# Patient Record
Sex: Female | Born: 1945 | Race: White | Hispanic: No | Smoking: Never smoker
Health system: Southern US, Community
[De-identification: ages and names within clinical notes are randomized; demographics above are authoritative.]

## PROBLEM LIST (undated history)

## (undated) DIAGNOSIS — E039 Hypothyroidism, unspecified: Secondary | ICD-10-CM

## (undated) HISTORY — PX: TONSILLECTOMY: SUR1361

---

## 2007-05-12 ENCOUNTER — Ambulatory Visit: Payer: Self-pay | Admitting: Internal Medicine

## 2009-10-26 ENCOUNTER — Ambulatory Visit: Payer: Self-pay | Admitting: Internal Medicine

## 2011-12-11 ENCOUNTER — Ambulatory Visit: Payer: Self-pay | Admitting: Internal Medicine

## 2013-06-11 ENCOUNTER — Telehealth: Payer: Self-pay | Admitting: Gastroenterology

## 2013-06-11 DIAGNOSIS — R932 Abnormal findings on diagnostic imaging of liver and biliary tract: Secondary | ICD-10-CM

## 2013-06-11 NOTE — Telephone Encounter (Signed)
i reviewed info on desk  She needs upper eus, 60 min, radial +/- linear, next avail EUS thurday with MAC sedation, dx: abnormal pancreas

## 2013-06-14 ENCOUNTER — Encounter (HOSPITAL_COMMUNITY): Payer: Self-pay | Admitting: Pharmacy Technician

## 2013-06-14 ENCOUNTER — Other Ambulatory Visit: Payer: Self-pay

## 2013-06-14 DIAGNOSIS — R932 Abnormal findings on diagnostic imaging of liver and biliary tract: Secondary | ICD-10-CM

## 2013-06-14 NOTE — Telephone Encounter (Signed)
Pt has been scheduled for 06/24/13 1215 pm [facility]

## 2013-06-15 ENCOUNTER — Other Ambulatory Visit: Payer: Self-pay | Admitting: Urgent Care

## 2013-06-15 ENCOUNTER — Encounter (HOSPITAL_COMMUNITY): Payer: Self-pay | Admitting: *Deleted

## 2013-06-15 NOTE — Telephone Encounter (Signed)
Left message on machine to call back  

## 2013-06-16 LAB — CANCER ANTIGEN 19-9: CA 19-9: 5 U/mL (ref 0–35)

## 2013-06-16 NOTE — Telephone Encounter (Signed)
EUS scheduled, pt instructed and medications reviewed.  Patient instructions mailed to home.  Patient to call with any questions or concerns.  

## 2013-06-24 ENCOUNTER — Encounter (HOSPITAL_COMMUNITY): Payer: Self-pay | Admitting: Anesthesiology

## 2013-06-24 ENCOUNTER — Encounter (HOSPITAL_COMMUNITY)
Admission: RE | Disposition: A | Discharge status: Home / Self Care | Payer: Self-pay | Source: Ambulatory Visit | Attending: Gastroenterology

## 2013-06-24 ENCOUNTER — Encounter (HOSPITAL_COMMUNITY): Payer: Self-pay | Admitting: Gastroenterology

## 2013-06-24 ENCOUNTER — Ambulatory Visit (HOSPITAL_COMMUNITY): Payer: BC Managed Care – PPO | Admitting: Anesthesiology

## 2013-06-24 ENCOUNTER — Ambulatory Visit (HOSPITAL_COMMUNITY)
Admission: RE | Admit: 2013-06-24 | Discharge: 2013-06-24 | Disposition: A | Discharge status: Home / Self Care | Payer: BC Managed Care – PPO | Source: Ambulatory Visit | Attending: Gastroenterology | Admitting: Gastroenterology

## 2013-06-24 DIAGNOSIS — R630 Anorexia: Secondary | ICD-10-CM | POA: Insufficient documentation

## 2013-06-24 DIAGNOSIS — R932 Abnormal findings on diagnostic imaging of liver and biliary tract: Secondary | ICD-10-CM

## 2013-06-24 DIAGNOSIS — R933 Abnormal findings on diagnostic imaging of other parts of digestive tract: Secondary | ICD-10-CM

## 2013-06-24 DIAGNOSIS — R634 Abnormal weight loss: Secondary | ICD-10-CM | POA: Insufficient documentation

## 2013-06-24 DIAGNOSIS — K862 Cyst of pancreas: Secondary | ICD-10-CM | POA: Insufficient documentation

## 2013-06-24 DIAGNOSIS — E039 Hypothyroidism, unspecified: Secondary | ICD-10-CM | POA: Insufficient documentation

## 2013-06-24 HISTORY — DX: Hypothyroidism, unspecified: E03.9

## 2013-06-24 HISTORY — PX: EUS: SHX5427

## 2013-06-24 SURGERY — UPPER ENDOSCOPIC ULTRASOUND (EUS) LINEAR
Anesthesia: Monitor Anesthesia Care

## 2013-06-24 MED ORDER — CIPROFLOXACIN HCL 500 MG PO TABS: 500.0000 mg | ORAL_TABLET | Freq: Two times a day (BID) | ORAL | Status: AC

## 2013-06-24 MED ORDER — CIPROFLOXACIN IN D5W 400 MG/200ML IV SOLN
400.0000 mg | Freq: Once | INTRAVENOUS | Status: AC
  Administered 2013-06-24: 400 mg via INTRAVENOUS

## 2013-06-24 MED ORDER — SODIUM CHLORIDE 0.9 % IV SOLN: INTRAVENOUS | Status: DC

## 2013-06-24 MED ORDER — PROPOFOL INFUSION 10 MG/ML OPTIME
INTRAVENOUS | Status: DC | PRN
  Administered 2013-06-24: 70 ug/kg/min via INTRAVENOUS

## 2013-06-24 MED ORDER — BUTAMBEN-TETRACAINE-BENZOCAINE 2-2-14 % EX AERO
INHALATION_SPRAY | CUTANEOUS | Status: DC | PRN
  Administered 2013-06-24: 2 via TOPICAL

## 2013-06-24 MED ORDER — MIDAZOLAM HCL 5 MG/5ML IJ SOLN
INTRAMUSCULAR | Status: DC | PRN
  Administered 2013-06-24: 2 mg via INTRAVENOUS

## 2013-06-24 MED ORDER — CIPROFLOXACIN IN D5W 400 MG/200ML IV SOLN
INTRAVENOUS | Status: AC
  Filled 2013-06-24: qty 200

## 2013-06-24 MED ORDER — KETAMINE HCL 10 MG/ML IJ SOLN
INTRAMUSCULAR | Status: DC | PRN
  Administered 2013-06-24: 10 mg via INTRAVENOUS

## 2013-06-24 MED ORDER — LACTATED RINGERS IV SOLN
INTRAVENOUS | Status: DC | PRN
  Administered 2013-06-24: 11:00:00 via INTRAVENOUS

## 2013-06-24 MED ORDER — LACTATED RINGERS IV SOLN
INTRAVENOUS | Status: DC
  Administered 2013-06-24: 1000 mL via INTRAVENOUS

## 2013-06-24 NOTE — Anesthesia Preprocedure Evaluation (Addendum)
Anesthesia Evaluation  Patient identified by MRN, date of birth, ID band Patient awake    Reviewed: Allergy & Precautions, H&P , NPO status , Patient's Chart, lab work & pertinent test results  Airway Mallampati: II TM Distance: >3 FB Neck ROM: Full    Dental  (+) Dental Advisory Given   Pulmonary neg pulmonary ROS,  breath sounds clear to auscultation        Cardiovascular negative cardio ROS  Rhythm:Regular Rate:Normal     Neuro/Psych negative neurological ROS  negative psych ROS   GI/Hepatic negative GI ROS, Neg liver ROS,   Endo/Other  Hypothyroidism   Renal/GU negative Renal ROS     Musculoskeletal negative musculoskeletal ROS (+)   Abdominal   Peds  Hematology negative hematology ROS (+)   Anesthesia Other Findings   Reproductive/Obstetrics negative OB ROS                          Anesthesia Physical Anesthesia Plan  ASA: II  Anesthesia Plan: MAC   Post-op Pain Management:    Induction:   Airway Management Planned:   Additional Equipment:   Intra-op Plan:   Post-operative Plan:   Informed Consent: I have reviewed the patients History and Physical, chart, labs and discussed the procedure including the risks, benefits and alternatives for the proposed anesthesia with the patient or authorized representative who has indicated his/her understanding and acceptance.   Dental advisory given  Plan Discussed with: CRNA  Anesthesia Plan Comments:         Anesthesia Quick Evaluation

## 2013-06-24 NOTE — Transfer of Care (Signed)
Immediate Anesthesia Transfer of Care Note  Patient: Alexa Meza  Procedure(s) Performed: Procedure(s): UPPER ENDOSCOPIC ULTRASOUND (EUS) LINEAR (N/A)  Patient Location: PACU  Anesthesia Type:MAC  Level of Consciousness: sedated  Airway & Oxygen Therapy: spontaneous respirations on nasal cannula  Post-op Assessment: Report given to PACU RN and Post -op Vital signs reviewed and stable  Post vital signs: Reviewed and stable  Complications: No apparent anesthesia complications

## 2013-06-24 NOTE — Op Note (Signed)
[facility] [address] [place], [address]   ENDOSCOPIC ULTRASOUND PROCEDURE REPORT  PATIENT: Alexa Meza, Alexa Meza  MR#: [id number] BIRTHDATE: January 19, 1946  GENDER: Female ENDOSCOPIST: Rachael Fee, MD REFERRED BY:  Midge Minium, MD at [facility] in [place], [place] PROCEDURE DATE:  06/24/2013 PROCEDURE:   Upper EUS w/FNA ASA CLASS:      Class II INDICATIONS:   anorexia, mild weight loss led to CT scan; this read as 'prominant pancreatic head and 7mm mass in body'. MEDICATIONS: MAC sedation, administered by CRNA; cirpo 400mg  IV  DESCRIPTION OF PROCEDURE:   After the risks benefits and alternatives of the procedure were  explained, informed consent was obtained. The patient was then placed in the left, lateral, decubitus postion and IV sedation was administered. Throughout the procedure, the patients blood pressure, pulse and oxygen saturations were monitored continuously.  Under direct visualization, the Pentax EUS Radial [id number]  endoscope was introduced through the mouth  and advanced to the second portion of the duodenum .  Water was used as necessary to provide an acoustic interface.  Upon completion of the imaging, water was removed and the patient was sent to the recovery room in satisfactory condition.   Endoscopic findings: 1. Normal UGI tract  EUS findings: 1. Anechoic (cystic) 6mm lesion in body of pancreas that clearly does NOT communicate with the main pancreatic duct. There are no associated solid masses or nodules. The cyst fluid was completely aspirated with a single pass of a 22 guage EUS FNA needle.  2cc of clear, thin fluid was all sent to cytology. 2. The pancreatic parenchyma was otherwise normal throughout the gland. 3. No peripancreatic adenopathy. 4. CBD was normal, non-dilated. 5. Main pancreatic duct was normal, non-dilated 6. Gallbladder was normal. 7. Limited views of liver, spleen, portal and splenic vessels  were all normal.  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  This was aspirated, sent to cytology.  Will advise on follow up pending cytology results.  She will complete 3 days of twice daily cipro.   _______________________________ eSigned:  Rachael Fee, MD 06/24/2013 11:53 AM

## 2013-06-24 NOTE — Anesthesia Postprocedure Evaluation (Signed)
Anesthesia Post Note  Patient: Alexa Meza  Procedure(s) Performed: Procedure(s) (LRB): UPPER ENDOSCOPIC ULTRASOUND (EUS) LINEAR (N/A)  Anesthesia type: MAC  Patient location: PACU  Post pain: Pain level controlled  Post assessment: Post-op Vital signs reviewed  Last Vitals: BP 144/97  Pulse 71  Temp(Src) 36.8 C (Oral)  Resp 24  Ht 5\' 3"  (1.6 m)  Wt 124 lb (56.246 kg)  BMI 21.97 kg/m2  SpO2 98%  Post vital signs: Reviewed  Level of consciousness: awake  Complications: No apparent anesthesia complications

## 2013-06-24 NOTE — H&P (Signed)
  HPI: This is a woman who underwent CT scan (outside).  This was done for anorexia and weight loss.  Report read as 'prominant pancreatic head and also 7mm 'mass' in body'.     Past Medical History  Diagnosis Date  . Hypothyroidism     HX OF NO CURRENT MEDS FOR, SEES  FAMILY MD FOR    Past Surgical History  Procedure Laterality Date  . Tonsillectomy  AS CHILD    No current facility-administered medications for this encounter.    Allergies as of 06/14/2013 - Review Complete 06/14/2013  Allergen Reaction Noted  . Contrast media [iodinated diagnostic agents] Other (See Comments) 06/14/2013    History reviewed. No pertinent family history.  History   Social History  . Marital Status: Divorced    Spouse Name: N/A    Number of Children: N/A  . Years of Education: N/A   Occupational History  . Not on file.   Social History Main Topics  . Smoking status: Never Smoker   . Smokeless tobacco: Never Used  . Alcohol Use: No  . Drug Use: No  . Sexual Activity: Not on file   Other Topics Concern  . Not on file   Social History Narrative  . No narrative on file      Physical Exam: There were no vitals taken for this visit. Constitutional: generally well-appearing Psychiatric: alert and oriented x3 Abdomen: soft, nontender, nondistended, no obvious ascites, no peritoneal signs, normal bowel sounds     Assessment and plan: 67 y.o. female with abnormal pancreas  For upper EUS today.

## 2013-06-25 ENCOUNTER — Encounter (HOSPITAL_COMMUNITY): Payer: Self-pay | Admitting: Gastroenterology

## 2013-07-12 ENCOUNTER — Ambulatory Visit: Payer: Self-pay | Admitting: Internal Medicine

## 2013-08-26 ENCOUNTER — Other Ambulatory Visit: Payer: Self-pay

## 2017-06-28 ENCOUNTER — Emergency Department: Payer: Medicare Other

## 2017-06-28 ENCOUNTER — Emergency Department
Admission: EM | Admit: 2017-06-28 | Discharge: 2017-06-28 | Disposition: A | Discharge status: Home / Self Care | Payer: Medicare Other | Attending: Emergency Medicine | Admitting: Emergency Medicine

## 2017-06-28 DIAGNOSIS — K862 Cyst of pancreas: Secondary | ICD-10-CM | POA: Diagnosis not present

## 2017-06-28 DIAGNOSIS — R140 Abdominal distension (gaseous): Secondary | ICD-10-CM | POA: Diagnosis not present

## 2017-06-28 DIAGNOSIS — Z79899 Other long term (current) drug therapy: Secondary | ICD-10-CM | POA: Diagnosis not present

## 2017-06-28 DIAGNOSIS — M549 Dorsalgia, unspecified: Secondary | ICD-10-CM | POA: Diagnosis present

## 2017-06-28 DIAGNOSIS — B029 Zoster without complications: Secondary | ICD-10-CM | POA: Insufficient documentation

## 2017-06-28 DIAGNOSIS — R197 Diarrhea, unspecified: Secondary | ICD-10-CM | POA: Diagnosis not present

## 2017-06-28 DIAGNOSIS — E039 Hypothyroidism, unspecified: Secondary | ICD-10-CM | POA: Diagnosis not present

## 2017-06-28 LAB — CBC WITH DIFFERENTIAL/PLATELET
Basophils Absolute: 0 10*3/uL (ref 0–0.1)
Basophils Relative: 1 %
Eosinophils Absolute: 0.1 10*3/uL (ref 0–0.7)
Eosinophils Relative: 2 %
HCT: 40.2 % (ref 35.0–47.0)
Hemoglobin: 14 g/dL (ref 12.0–16.0)
Lymphocytes Relative: 19 %
Lymphs Abs: 1.4 10*3/uL (ref 1.0–3.6)
MCH: 32.8 pg (ref 26.0–34.0)
MCHC: 34.9 g/dL (ref 32.0–36.0)
MCV: 93.9 fL (ref 80.0–100.0)
Monocytes Absolute: 0.6 10*3/uL (ref 0.2–0.9)
Monocytes Relative: 8 %
Neutro Abs: 5.4 10*3/uL (ref 1.4–6.5)
Neutrophils Relative %: 70 %
Platelets: 275 10*3/uL (ref 150–440)
RBC: 4.28 MIL/uL (ref 3.80–5.20)
RDW: 12.5 % (ref 11.5–14.5)
WBC: 7.5 10*3/uL (ref 3.6–11.0)

## 2017-06-28 LAB — COMPREHENSIVE METABOLIC PANEL
ALT: 17 U/L (ref 14–54)
AST: 24 U/L (ref 15–41)
Albumin: 4.3 g/dL (ref 3.5–5.0)
Alkaline Phosphatase: 63 U/L (ref 38–126)
Anion gap: 10 (ref 5–15)
BUN: 10 mg/dL (ref 6–20)
CO2: 22 mmol/L (ref 22–32)
Calcium: 9.6 mg/dL (ref 8.9–10.3)
Chloride: 106 mmol/L (ref 101–111)
Creatinine, Ser: 0.48 mg/dL (ref 0.44–1.00)
GFR calc Af Amer: >60 mL/min (ref >60)
GFR calc non Af Amer: >60 mL/min (ref >60)
Glucose, Bld: 81 mg/dL (ref 65–99)
Potassium: 3.7 mmol/L (ref 3.5–5.1)
Sodium: 138 mmol/L (ref 135–145)
Total Bilirubin: 0.9 mg/dL (ref 0.3–1.2)
Total Protein: 7.5 g/dL (ref 6.5–8.1)

## 2017-06-28 LAB — URINALYSIS, COMPLETE (UACMP) WITH MICROSCOPIC
Bacteria, UA: NONE SEEN
Bilirubin Urine: NEGATIVE
Glucose, UA: NEGATIVE mg/dL
Ketones, ur: 20 mg/dL — AB
Leukocytes, UA: NEGATIVE
Nitrite: NEGATIVE
Protein, ur: NEGATIVE mg/dL
Specific Gravity, Urine: 1.011 (ref 1.005–1.030)
Squamous Epithelial / HPF: NONE SEEN
pH: 5 (ref 5.0–8.0)

## 2017-06-28 LAB — TROPONIN I: Troponin I: <0.03 ng/mL (ref <0.03)

## 2017-06-28 LAB — LIPASE, BLOOD: Lipase: 27 U/L (ref 11–51)

## 2017-06-28 LAB — AMYLASE: Amylase: 57 U/L (ref 28–100)

## 2017-06-28 MED ORDER — ORPHENADRINE CITRATE 30 MG/ML IJ SOLN
60.0000 mg | Freq: Two times a day (BID) | INTRAMUSCULAR | Status: DC
  Administered 2017-06-28: 60 mg via INTRAMUSCULAR
  Filled 2017-06-28: qty 2

## 2017-06-28 MED ORDER — ONDANSETRON HCL 4 MG PO TABS: 4.0000 mg | ORAL_TABLET | Freq: Three times a day (TID) | ORAL | 1 refills | Status: AC | PRN

## 2017-06-28 MED ORDER — ACYCLOVIR 800 MG PO TABS: 800.0000 mg | ORAL_TABLET | Freq: Every day | ORAL | 0 refills | Status: AC

## 2017-06-28 MED ORDER — IOPAMIDOL (ISOVUE-300) INJECTION 61%
30.0000 mL | Freq: Once | INTRAVENOUS | Status: DC | PRN
  Filled 2017-06-28: qty 30

## 2017-06-28 MED ORDER — IOPAMIDOL (ISOVUE-370) INJECTION 76%
75.0000 mL | Freq: Once | INTRAVENOUS | Status: AC | PRN
  Administered 2017-06-28: 75 mL via INTRAVENOUS
  Filled 2017-06-28: qty 75

## 2017-06-28 MED ORDER — OXYCODONE-ACETAMINOPHEN 5-325 MG PO TABS: 1.0000 | ORAL_TABLET | Freq: Four times a day (QID) | ORAL | 0 refills | Status: AC | PRN

## 2017-06-28 NOTE — ED Triage Notes (Signed)
Pt came to ED via pov c/o back pain starting last night. Reports upper back pain. No urinary symptoms. History of herniated disc but pt reports that was many year ago.

## 2017-06-28 NOTE — ED Provider Notes (Signed)
-----------------------------------------   4:37 PM on 06/28/2017 -----------------------------------------  ED ECG REPORT I, Angline Schweigert, the attending physician, personally viewed and interpreted this ECG.  Date: 06/28/2017 EKG Time: 16:32 Rate: 72 Rhythm: normal sinus rhythm with rare PVC QRS Axis: normal Intervals: normal ST/T Wave abnormalities: normal Narrative Interpretation: no evidence of acute ischemia    Loleta RoseForbach, Hakim Minniefield, MD 06/28/17 [phone]

## 2017-06-28 NOTE — ED Provider Notes (Signed)
[facility] Emergency Department Provider Note  ____________________________________________  Time seen: Approximately 3:54 PM  I have reviewed the triage vital signs and the nursing notes.   HISTORY  Chief Complaint Back Pain    HPI Alexa Meza is a 71 y.o. female with a history of pancreatic cyst presents to the emergency department with 8/10 left "upper back pain" worse with sitting and improved with ambulation that started one day ago. Patient describes pain as an "incredible pressure" that radiates to left side in a dermatomal distribution. Patient states that she cannot sleep through the night due to pain. She states that she considered calling EMS but waited until this morning so that she could drive herself. Patient has never been transported to an ED via EMS in the past. She states that she felt "very hot" last night. She has also noticed abdominal distention. She denies dysuria, hematuria and increased urinary frequency. Patient denies a history of pyelonephritis or nephrolithiasis. Patient states that it is been "several years" since she had a urinary tract infection. Patient currently lives alone and works part-time as a Marketing executiveMontessori school teacher online. She is a retired Chartered loss adjusterschoolteacher. She has had intermittent diarrhea but denies nausea and vomiting. She denies chest pain, chest tightness, shortness of breath and daily smoking.   Past Medical History:  Diagnosis Date  . Hypothyroidism    HX OF NO CURRENT MEDS FOR, SEES  FAMILY MD FOR    Patient Active Problem List   Diagnosis Date Noted  . Nonspecific (abnormal) findings on radiological and other examination of gastrointestinal tract 06/24/2013    Past Surgical History:  Procedure Laterality Date  . EUS N/A 06/24/2013   Procedure: UPPER ENDOSCOPIC ULTRASOUND (EUS) LINEAR;  Surgeon: Rachael Feeaniel P Jacobs, MD;  Location: [facility] ENDOSCOPY;  Service: Endoscopy;  Laterality: N/A;  . TONSILLECTOMY  AS CHILD     Prior to Admission medications   Medication Sig Start Date End Date Taking? Authorizing Provider  acyclovir (ZOVIRAX) 800 MG tablet Take 1 tablet (800 mg total) by mouth 5 (five) times daily. 06/28/17 07/08/17  Orvil FeilWoods, Jaclyn M, PA-C  Aloe Vera GEL Apply topically as needed. TO CHEST AREA    [provider]  calcium carbonate (OS-CAL) 600 MG TABS tablet Take 600 mg by mouth 2 (two) times daily with a meal.    [provider]  cholecalciferol (VITAMIN D) 1000 UNITS tablet Take 1,000 Units by mouth daily.    [provider]  ciprofloxacin (CIPRO) 500 MG tablet Take 1 tablet (500 mg total) by mouth 2 (two) times daily. 06/24/13   Rachael FeeJacobs, Daniel P, MD  fish oil-omega-3 fatty acids 1000 MG capsule Take 1 g by mouth daily.    [provider]  Multiple Vitamin (MULTIVITAMIN WITH MINERALS) TABS tablet Take 1 tablet by mouth daily.    [provider]  ondansetron (ZOFRAN) 4 MG tablet Take 1 tablet (4 mg total) by mouth every 8 (eight) hours as needed for nausea or vomiting. 06/28/17 07/03/17  Orvil FeilWoods, Jaclyn M, PA-C  oxyCODONE-acetaminophen (ROXICET) 5-325 MG tablet Take 1 tablet by mouth every 6 (six) hours as needed for severe pain. 06/28/17 07/03/17  Orvil FeilWoods, Jaclyn M, PA-C    Allergies Patient has no active allergies.  No family history on file.  Social History Social History  Substance Use Topics  . Smoking status: Never Smoker  . Smokeless tobacco: Never Used  . Alcohol use No     Review of Systems  Constitutional: No fever/chills Eyes:  No visual changes. No discharge ENT: No upper respiratory complaints. Cardiovascular: no chest pain. Respiratory: no cough. No SOB. Gastrointestinal: No abdominal pain.  No nausea, no vomiting. She has diarrhea. No constipation. Musculoskeletal: Patient has left upper back pain.  Skin: Negative for rash, abrasions, lacerations, ecchymosis. Neurological: Negative for headaches, focal weakness or  numbness.  ___________________________________________   PHYSICAL EXAM:  VITAL SIGNS: ED Triage Vitals  Enc Vitals Group     BP 06/28/17 1424 (!) 168/93     Pulse Rate 06/28/17 1424 78     Resp 06/28/17 1424 18     Temp 06/28/17 1424 97.6 F (36.4 C)     Temp Source 06/28/17 1424 Oral     SpO2 06/28/17 1424 97 %     Weight --      Height --      Head Circumference --      Peak Flow --      Pain Score 06/28/17 1447 5     Pain Loc --      Pain Edu? --      Excl. in GC? --      Constitutional: Alert and oriented. Well appearing and in no acute distress. Eyes: Conjunctivae are normal. PERRL. EOMI. Head: Atraumatic. Cardiovascular: Normal rate, regular rhythm. Normal S1 and S2.  Good peripheral circulation. Respiratory: Normal respiratory effort without tachypnea or retractions. Lungs CTAB. Good air entry to the bases with no decreased or absent breath sounds. Gastrointestinal: Bowel sounds 4 quadrants. Soft and nontender to palpation. No guarding or rigidity. No palpable masses. No distention. Patient has left sided CVA tenderness.  Musculoskeletal: Full range of motion to all extremities. No gross deformities appreciated. Neurologic:  Normal speech and language. No gross focal neurologic deficits are appreciated.  Skin:  Faint macular rash is visible along the left flank. Psychiatric: Mood and affect are normal. Speech and behavior are normal. Patient exhibits appropriate insight and judgement.   ____________________________________________   LABS (all labs ordered are listed, but only abnormal results are displayed)  Labs Reviewed  URINALYSIS, COMPLETE (UACMP) WITH MICROSCOPIC - Abnormal; Notable for the following:       Result Value   Color, Urine YELLOW (*)    APPearance CLEAR (*)    Hgb urine dipstick SMALL (*)    Ketones, ur 20 (*)    All other components within normal limits  CBC WITH DIFFERENTIAL/PLATELET  COMPREHENSIVE METABOLIC PANEL  AMYLASE  LIPASE,  BLOOD  TROPONIN I   ____________________________________________  EKG  Normal sinus rhythm with occasional PVC ____________________________________________  RADIOLOGY  Geraldo Pitter, personally viewed and evaluated these images  as part of my medical decision making, as well as reviewing the written report by the radiologist.    Dg Chest 2 View  Result Date: 06/28/2017 CLINICAL DATA:  Pt states she began having upper back pain and discomfort last night; pt denies any previous hx; nonsmoker EXAM: CHEST  2 VIEW COMPARISON:  None. FINDINGS: Heart size and mediastinal contours are normal. Lungs are clear. No pleural effusion or pneumothorax seen. Mild degenerative spurring noted within the thoracic spine. No acute or suspicious osseous finding. IMPRESSION: No active cardiopulmonary disease. No evidence of pneumonia or pulmonary edema. Electronically Signed   By: Bary Richard M.D.   On: 06/28/2017 17:21   Dg Thoracic Spine 2 View  Result Date: 06/28/2017 CLINICAL DATA:  Mid back pain radiating to LEFT side for 2-3 days much worse at night, intermittent LEFT shoulder pain for 2-3 months  EXAM: THORACIC SPINE 2 VIEWS COMPARISON:  Chest radiograph 06/28/2017 FINDINGS: Twelve pairs of ribs. Bones appear demineralized. Scattered mild disc space narrowing. Vertebral body heights maintained without fracture or subluxation. No bone destruction. Minimal biconvex thoracic scoliosis. Visualized posterior ribs unremarkable. IMPRESSION: Osseous mineralization with minimal scoliosis degenerative disc disease changes. No definite acute bony abnormalities. Electronically Signed   By: Ulyses Southward M.D.   On: 06/28/2017 18:08   Ct Angio Abd/pel W/ And/or W/o  Result Date: 06/28/2017 CLINICAL DATA:  Back pain starting last night, pulsatile abdominal mass +abdominal aortic aneurysm EXAM: CTA ABDOMEN AND PELVIS WITHOUT AND WITH CONTRAST TECHNIQUE: Multidetector CT imaging of the abdomen and pelvis was performed  using the standard protocol during bolus administration of intravenous contrast. Multiplanar reconstructed images and MIPs were obtained and reviewed to evaluate the vascular anatomy. CONTRAST:  75 cc Isovue 370 IV ; no oral contrast administered COMPARISON:  None FINDINGS: VASCULAR Aorta: Aorta normal caliber without aneurysm or dissection. Minimal atherosclerotic calcification. No para-aortic hemorrhage/infiltration. Celiac: Mild narrowing at the origin due to calcified plaque, approximately 50%. Normal caliber. SMA: Patent, unremarkable. Renals: Patent single renal arteries bilaterally. IMA: Patent Inflow: Patent , normal appearance Proximal Outflow: Normal appearance Veins: Un opacified, suboptimally assessed Review of the MIP images confirms the above findings. NON-VASCULAR Lower chest: Lung bases clear Hepatobiliary: Minimal dependent density in gallbladder question tiny gallstones. Liver unremarkable. No biliary dilatation Pancreas: Cystic lesion at proximal pancreatic tail 11 x 9 x 11 mm in size with a small calcification in the wall. Remainder of pancreas normal appearance Spleen: Normal appearance Adrenals/Urinary Tract: Adrenal glands, kidneys, ureters, and bladder normal appearance Stomach/Bowel: Normal appendix. Stomach and bowel loops unremarkable for technique. Lymphatic: No adenopathy. Reproductive: Unremarkable uterus and ovaries Other: No free air or free fluid. No hernia or inflammatory process. Musculoskeletal: Advanced degenerative disc disease changes at L1-L2 and L2-L3. No acute bony findings. IMPRESSION: VASCULAR No evidence of aortic aneurysm or dissection. 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 NON-VASCULAR Cystic lesion at proximal pancreatic tail 11 x 9 x 11 mm containing a small mural calcification. Recommend follow up pre and post contrast MRI/MRCP or pancreatic protocol CT in 2 years. This recommendation follows ACR consensus guidelines:  Management of Incidental Pancreatic Cysts: A White Paper of the ACR Incidental Findings Committee. J Am Coll Radiol 2017;14:911-923. Electronically Signed   By: Ulyses Southward M.D.   On: 06/28/2017 18:29    ____________________________________________    PROCEDURES  Procedure(s) performed:    Procedures    Medications  orphenadrine (NORFLEX) injection 60 mg (60 mg Intramuscular Given 06/28/17 1618)  iopamidol (ISOVUE-300) 61 % injection 30 mL ( Oral Canceled Entry 06/28/17 1722)  iopamidol (ISOVUE-370) 76 % injection 75 mL (75 mLs Intravenous Contrast Given 06/28/17 1739)     ____________________________________________   INITIAL IMPRESSION / ASSESSMENT AND PLAN / ED COURSE  Pertinent labs & imaging results that were available during my care of the patient were reviewed by me and considered in my medical decision making (see chart for details).  Review of the [place] CSRS was performed in accordance of the [facility] prior to dispensing any controlled drugs.     Assessment and Plan:  Back pain Patient presents to the emergency department with left flank pain described as "incredible pressure". Differential diagnosis included nephrolithiasis, pyelonephritis, mesenteric ischemia, abdominal aortic aneurysm and shingles. Workup conducted in the emergency department was reassuring. Dr.Goodman was consulted regarding patient's case. Patient was treated empirically for shingles. She  was discharged with acyclovir, Roxicet and Zofran. Patient was advised to follow-up with primary care as needed. All patient questions were answered.   ____________________________________________  FINAL CLINICAL IMPRESSION(S) / ED DIAGNOSES  Final diagnoses:  Herpes zoster without complication      NEW MEDICATIONS STARTED DURING THIS VISIT:  Discharge Medication List as of 06/28/2017  7:11 PM    START taking these medications   Details  acyclovir (ZOVIRAX) 800 MG tablet Take 1 tablet (800 mg total) by mouth 5  (five) times daily., Starting Sat 06/28/2017, Until Tue 07/08/2017, Print    ondansetron (ZOFRAN) 4 MG tablet Take 1 tablet (4 mg total) by mouth every 8 (eight) hours as needed for nausea or vomiting., Starting Sat 06/28/2017, Until Thu 07/03/2017, Print    oxyCODONE-acetaminophen (ROXICET) 5-325 MG tablet Take 1 tablet by mouth every 6 (six) hours as needed for severe pain., Starting Sat 06/28/2017, Until Thu 07/03/2017, Print            This chart was dictated using voice recognition software/Dragon. Despite best efforts to proofread, errors can occur which can change the meaning. Any change was purely unintentional.    Orvil Feil, PA-C 06/28/17 1956    Phineas Semen, MD 06/28/17 2120

## 2019-08-17 ENCOUNTER — Other Ambulatory Visit: Payer: Self-pay | Admitting: *Deleted

## 2019-08-17 DIAGNOSIS — Z20822 Contact with and (suspected) exposure to covid-19: Secondary | ICD-10-CM

## 2019-08-19 LAB — NOVEL CORONAVIRUS, NAA: SARS-CoV-2, NAA: NOT DETECTED

## 2019-09-13 ENCOUNTER — Other Ambulatory Visit: Payer: Self-pay

## 2019-09-13 DIAGNOSIS — Z20822 Contact with and (suspected) exposure to covid-19: Secondary | ICD-10-CM

## 2019-09-15 LAB — NOVEL CORONAVIRUS, NAA: SARS-CoV-2, NAA: NOT DETECTED

## 2021-07-31 ENCOUNTER — Other Ambulatory Visit: Payer: Self-pay | Admitting: Internal Medicine

## 2021-07-31 ENCOUNTER — Other Ambulatory Visit (HOSPITAL_BASED_OUTPATIENT_CLINIC_OR_DEPARTMENT_OTHER): Payer: Self-pay | Admitting: Internal Medicine

## 2021-07-31 DIAGNOSIS — K862 Cyst of pancreas: Secondary | ICD-10-CM

## 2021-07-31 DIAGNOSIS — R5382 Chronic fatigue, unspecified: Secondary | ICD-10-CM

## 2021-08-01 ENCOUNTER — Other Ambulatory Visit: Payer: Self-pay | Admitting: Internal Medicine

## 2021-08-01 DIAGNOSIS — Z1231 Encounter for screening mammogram for malignant neoplasm of breast: Secondary | ICD-10-CM

## 2021-08-11 ENCOUNTER — Ambulatory Visit
Admission: RE | Admit: 2021-08-11 | Discharge: 2021-08-11 | Disposition: A | Discharge status: Home / Self Care | Payer: Medicare PPO | Source: Ambulatory Visit | Attending: Internal Medicine | Admitting: Internal Medicine

## 2021-08-11 ENCOUNTER — Other Ambulatory Visit: Payer: Self-pay

## 2021-08-11 DIAGNOSIS — K862 Cyst of pancreas: Secondary | ICD-10-CM | POA: Diagnosis present

## 2021-08-11 DIAGNOSIS — R5382 Chronic fatigue, unspecified: Secondary | ICD-10-CM

## 2021-08-11 MED ORDER — GADOBUTROL 1 MMOL/ML IV SOLN
5.0000 mL | Freq: Once | INTRAVENOUS | Status: AC | PRN
  Administered 2021-08-11: 5 mL via INTRAVENOUS

## 2021-09-05 ENCOUNTER — Other Ambulatory Visit: Payer: Self-pay

## 2021-09-05 ENCOUNTER — Ambulatory Visit
Admission: RE | Admit: 2021-09-05 | Discharge: 2021-09-05 | Disposition: A | Discharge status: Home / Self Care | Payer: Medicare PPO | Source: Ambulatory Visit | Attending: Internal Medicine | Admitting: Internal Medicine

## 2021-09-05 DIAGNOSIS — Z1231 Encounter for screening mammogram for malignant neoplasm of breast: Secondary | ICD-10-CM | POA: Insufficient documentation

## 2022-09-19 ENCOUNTER — Encounter: Payer: Self-pay | Admitting: Ophthalmology

## 2022-09-23 NOTE — Anesthesia Preprocedure Evaluation (Signed)
Anesthesia Evaluation  Patient identified by MRN, date of birth, ID band Patient awake    Reviewed: Allergy & Precautions, NPO status , Patient's Chart, lab work & pertinent test results  History of Anesthesia Complications Negative for: history of anesthetic complications  Airway Mallampati: I   Neck ROM: Full    Dental  (+) Missing   Pulmonary neg pulmonary ROS   Pulmonary exam normal breath sounds clear to auscultation       Cardiovascular hypertension, Normal cardiovascular exam Rhythm:Regular Rate:Normal     Neuro/Psych negative neurological ROS     GI/Hepatic negative GI ROS,,,  Endo/Other  Hypothyroidism    Renal/GU negative Renal ROS     Musculoskeletal   Abdominal   Peds  Hematology negative hematology ROS (+)   Anesthesia Other Findings   Reproductive/Obstetrics                             Anesthesia Physical Anesthesia Plan  ASA: 2  Anesthesia Plan: MAC   Post-op Pain Management:    Induction: Intravenous  PONV Risk Score and Plan: 2 and Treatment may vary due to age or medical condition, Midazolam and TIVA  Airway Management Planned: Natural Airway and Nasal Cannula  Additional Equipment:   Intra-op Plan:   Post-operative Plan:   Informed Consent: I have reviewed the patients History and Physical, chart, labs and discussed the procedure including the risks, benefits and alternatives for the proposed anesthesia with the patient or authorized representative who has indicated his/her understanding and acceptance.     Dental advisory given  Plan Discussed with: CRNA  Anesthesia Plan Comments: (LMA/GETA backup discussed.  Patient consented for risks of anesthesia including but not limited to:  - adverse reactions to medications - damage to eyes, teeth, lips or other oral mucosa - nerve damage due to positioning  - sore throat or hoarseness - damage to heart,  brain, nerves, lungs, other parts of body or loss of life  Informed patient about role of CRNA in peri- and intra-operative care.  Patient voiced understanding.)       Anesthesia Quick Evaluation

## 2022-09-23 NOTE — Discharge Instructions (Signed)

## 2022-09-25 ENCOUNTER — Encounter
Admission: RE | Disposition: A | Discharge status: Home / Self Care | Payer: Self-pay | Source: Home / Self Care | Attending: Ophthalmology

## 2022-09-25 ENCOUNTER — Ambulatory Visit
Admission: RE | Admit: 2022-09-25 | Discharge: 2022-09-25 | Disposition: A | Discharge status: Home / Self Care | Payer: Medicare PPO | Attending: Ophthalmology | Admitting: Ophthalmology

## 2022-09-25 ENCOUNTER — Ambulatory Visit: Payer: Medicare PPO | Admitting: General Practice

## 2022-09-25 ENCOUNTER — Other Ambulatory Visit: Payer: Self-pay

## 2022-09-25 DIAGNOSIS — H2512 Age-related nuclear cataract, left eye: Secondary | ICD-10-CM | POA: Diagnosis present

## 2022-09-25 DIAGNOSIS — E039 Hypothyroidism, unspecified: Secondary | ICD-10-CM | POA: Diagnosis not present

## 2022-09-25 HISTORY — PX: CATARACT EXTRACTION W/PHACO: SHX586

## 2022-09-25 SURGERY — PHACOEMULSIFICATION, CATARACT, WITH IOL INSERTION
Anesthesia: Monitor Anesthesia Care | Site: Eye | Laterality: Left

## 2022-09-25 MED ORDER — SIGHTPATH DOSE#1 NA HYALUR & NA CHOND-NA HYALUR IO KIT
PACK | INTRAOCULAR | Status: DC | PRN
  Administered 2022-09-25: 1 via OPHTHALMIC

## 2022-09-25 MED ORDER — BRIMONIDINE TARTRATE-TIMOLOL 0.2-0.5 % OP SOLN
OPHTHALMIC | Status: DC | PRN
  Administered 2022-09-25: 1 [drp] via OPHTHALMIC

## 2022-09-25 MED ORDER — TETRACAINE HCL 0.5 % OP SOLN
1.0000 [drp] | OPHTHALMIC | Status: DC | PRN
  Administered 2022-09-25 (×2): 1 [drp] via OPHTHALMIC

## 2022-09-25 MED ORDER — ACETAMINOPHEN 325 MG PO TABS: 650.0000 mg | ORAL_TABLET | Freq: Once | ORAL | Status: DC | PRN

## 2022-09-25 MED ORDER — CEFUROXIME OPHTHALMIC INJECTION 1 MG/0.1 ML
INJECTION | OPHTHALMIC | Status: DC | PRN
  Administered 2022-09-25: .1 mL via INTRACAMERAL

## 2022-09-25 MED ORDER — MIDAZOLAM HCL 2 MG/2ML IJ SOLN
INTRAMUSCULAR | Status: DC | PRN
  Administered 2022-09-25: 2 mg via INTRAVENOUS

## 2022-09-25 MED ORDER — LACTATED RINGERS IV SOLN: INTRAVENOUS | Status: DC

## 2022-09-25 MED ORDER — SIGHTPATH DOSE#1 BSS IO SOLN
INTRAOCULAR | Status: DC | PRN
  Administered 2022-09-25: 64 mL via OPHTHALMIC

## 2022-09-25 MED ORDER — SIGHTPATH DOSE#1 BSS IO SOLN
INTRAOCULAR | Status: DC | PRN
  Administered 2022-09-25: 1 mL

## 2022-09-25 MED ORDER — FENTANYL CITRATE (PF) 100 MCG/2ML IJ SOLN
INTRAMUSCULAR | Status: DC | PRN
  Administered 2022-09-25 (×2): 50 ug via INTRAVENOUS

## 2022-09-25 MED ORDER — ONDANSETRON HCL 4 MG/2ML IJ SOLN: 4.0000 mg | Freq: Once | INTRAMUSCULAR | Status: DC | PRN

## 2022-09-25 MED ORDER — ACETAMINOPHEN 160 MG/5ML PO SOLN: 325.0000 mg | ORAL | Status: DC | PRN

## 2022-09-25 MED ORDER — ARMC OPHTHALMIC DILATING DROPS
1.0000 | OPHTHALMIC | Status: DC | PRN
  Administered 2022-09-25 (×3): 1 via OPHTHALMIC

## 2022-09-25 MED ORDER — SIGHTPATH DOSE#1 BSS IO SOLN
INTRAOCULAR | Status: DC | PRN
  Administered 2022-09-25: 15 mL

## 2022-09-25 SURGICAL SUPPLY — 20 items
CANNULA ANT/CHMB 27G (MISCELLANEOUS) IMPLANT
CANNULA ANT/CHMB 27GA (MISCELLANEOUS) IMPLANT
CATARACT SUITE SIGHTPATH (MISCELLANEOUS) ×1 IMPLANT
FEE CATARACT SUITE SIGHTPATH (MISCELLANEOUS) ×1 IMPLANT
GLOVE SRG 8 PF TXTR STRL LF DI (GLOVE) ×1 IMPLANT
GLOVE SURG ENC TEXT LTX SZ7.5 (GLOVE) ×1 IMPLANT
GLOVE SURG GAMMEX PI TX LF 7.5 (GLOVE) IMPLANT
GLOVE SURG UNDER POLY LF SZ8 (GLOVE) ×1
LENS IOL TECNIS EYHANCE 20.0 (Intraocular Lens) IMPLANT
NDL FILTER BLUNT 18X1 1/2 (NEEDLE) ×1 IMPLANT
NDL RETROBULBAR .5 NSTRL (NEEDLE) IMPLANT
NEEDLE FILTER BLUNT 18X1 1/2 (NEEDLE) ×1 IMPLANT
PACK VIT ANT 23G (MISCELLANEOUS) IMPLANT
RING MALYGIN 7.0 (MISCELLANEOUS) IMPLANT
SUT ETHILON 10-0 CS-B-6CS-B-6 (SUTURE)
SUT VICRYL  9 0 (SUTURE)
SUT VICRYL 9 0 (SUTURE) IMPLANT
SUTURE EHLN 10-0 CS-B-6CS-B-6 (SUTURE) IMPLANT
SYR 3ML LL SCALE MARK (SYRINGE) ×1 IMPLANT
WATER STERILE IRR 250ML POUR (IV SOLUTION) ×1 IMPLANT

## 2022-09-25 NOTE — Op Note (Signed)
OPERATIVE NOTE  OPIE FANTON [id number] 09/25/2022   PREOPERATIVE DIAGNOSIS:  Nuclear sclerotic cataract left eye. H25.12   POSTOPERATIVE DIAGNOSIS:    Nuclear sclerotic cataract left eye.     PROCEDURE:  Phacoemusification with posterior chamber intraocular lens placement of the left eye  Ultrasound time: Procedure(s) with comments: CATARACT EXTRACTION PHACO AND INTRAOCULAR LENS PLACEMENT (IOC) LEFT (Left) - 4.50 0:52.2  LENS:   Implant Name Type Inv. Item Serial No. Manufacturer Lot No. LRB No. Used Action  LENS IOL TECNIS EYHANCE 20.0 - [id number] Intraocular Lens LENS IOL TECNIS EYHANCE 20.0 [id number] [employer]  Left 1 Implanted      SURGEON:  Deirdre Evener, MD   ANESTHESIA:  Topical with tetracaine drops and 2% Xylocaine jelly, augmented with 1% preservative-free intracameral lidocaine.    COMPLICATIONS:  None.   DESCRIPTION OF PROCEDURE:  The patient was identified in the holding room and transported to the operating room and placed in the supine position under the operating microscope.  The left eye was identified as the operative eye and it was prepped and draped in the usual sterile ophthalmic fashion.   A 1 millimeter clear-corneal paracentesis was made at the 1:30 position.  0.5 ml of preservative-free 1% lidocaine was injected into the anterior chamber.  The anterior chamber was filled with Viscoat viscoelastic.  A 2.4 millimeter keratome was used to make a near-clear corneal incision at the 10:30 position.  .  A curvilinear capsulorrhexis was made with a cystotome and capsulorrhexis forceps.  Balanced salt solution was used to hydrodissect and hydrodelineate the nucleus.   Phacoemulsification was then used in stop and chop fashion to remove the lens nucleus and epinucleus.  The remaining cortex was then removed using the irrigation and aspiration handpiece. Provisc was then placed into the capsular bag to distend it for lens placement.  A lens was then  injected into the capsular bag.  The remaining viscoelastic was aspirated.   Wounds were hydrated with balanced salt solution.  The anterior chamber was inflated to a physiologic pressure with balanced salt solution.  No wound leaks were noted. Cefuroxime 0.1 ml of a 10mg /ml solution was injected into the anterior chamber for a dose of 1 mg of intracameral antibiotic at the completion of the case.   Timolol and Brimonidine drops were applied to the eye.  The patient was taken to the recovery room in stable condition without complications of anesthesia or surgery.  Racquelle Hyser 09/25/2022, 9:40 AM

## 2022-09-25 NOTE — Anesthesia Postprocedure Evaluation (Signed)
Anesthesia Post Note  Patient: Alexa Meza  Procedure(s) Performed: CATARACT EXTRACTION PHACO AND INTRAOCULAR LENS PLACEMENT (IOC) LEFT (Left: Eye)  Patient location during evaluation: PACU Anesthesia Type: MAC Level of consciousness: awake and alert, oriented and patient cooperative Pain management: pain level controlled Vital Signs Assessment: post-procedure vital signs reviewed and stable Respiratory status: spontaneous breathing, nonlabored ventilation and respiratory function stable Cardiovascular status: blood pressure returned to baseline and stable Postop Assessment: adequate PO intake Anesthetic complications: no   There were no known notable events for this encounter.   Last Vitals:  Vitals:   09/25/22 0942 09/25/22 0946  BP: (!) 146/93 (!) 146/93  Pulse: 67 67  Resp: 14 13  Temp: 36.6 C 36.6 C  SpO2: 95% 96%    Last Pain:  Vitals:   09/25/22 0946  TempSrc:   PainSc: 0-No pain                 Darrin Nipper

## 2022-09-25 NOTE — H&P (Signed)
[facility]   Primary Care Physician:  Lauro Regulus, MD Ophthalmologist: Dr. Lockie Mola  Pre-Procedure History & Physical: HPI:  Alexa Meza is a 76 y.o. female here for ophthalmic surgery.   Past Medical History:  Diagnosis Date   Hypothyroidism    HX OF NO CURRENT MEDS FOR, SEES  FAMILY MD FOR    Past Surgical History:  Procedure Laterality Date   EUS N/A 06/24/2013   Procedure: UPPER ENDOSCOPIC ULTRASOUND (EUS) LINEAR;  Surgeon: Rachael Fee, MD;  Location: [facility];  Service: Endoscopy;  Laterality: N/A;   TONSILLECTOMY  AS CHILD    Prior to Admission medications   Medication Sig Start Date End Date Taking? Authorizing Provider  amLODipine (NORVASC) 2.5 MG tablet Take 2.5 mg by mouth daily. Not taking right now   Yes [provider]  cholecalciferol (VITAMIN D) 1000 UNITS tablet Take 1,000 Units by mouth daily.   Yes [provider]  fish oil-omega-3 fatty acids 1000 MG capsule Take 1 g by mouth daily.   Yes [provider]  levothyroxine (SYNTHROID) 25 MCG tablet Take 25 mcg by mouth daily before breakfast.   Yes [provider]  magnesium oxide (MAG-OX) 400 (240 Mg) MG tablet Take 400 mg by mouth daily.   Yes [provider]  Multiple Vitamin (MULTIVITAMIN WITH MINERALS) TABS tablet Take 1 tablet by mouth daily.   Yes [provider]  Aloe Vera GEL Apply topically as needed. TO CHEST AREA Patient not taking: Reported on 09/19/2022    [provider]  calcium carbonate (OS-CAL) 600 MG TABS tablet Take 600 mg by mouth 2 (two) times daily with a meal. Patient not taking: Reported on 09/19/2022    [provider]  ciprofloxacin (CIPRO) 500 MG tablet Take 1 tablet (500 mg total) by mouth 2 (two) times daily. Patient not taking: Reported on 09/19/2022 06/24/13   Rachael Fee, MD    Allergies as of 08/06/2022   (No Active Allergies)    History reviewed. No pertinent  family history.  Social History   Socioeconomic History   Marital status: Divorced    Spouse name: Not on file   Number of children: Not on file   Years of education: Not on file   Highest education level: Not on file  Occupational History   Not on file  Tobacco Use   Smoking status: Never   Smokeless tobacco: Never  Substance and Sexual Activity   Alcohol use: No   Drug use: No   Sexual activity: Not on file  Other Topics Concern   Not on file  Social History Narrative   Not on file   Social Determinants of Health   Financial Resource Strain: Not on file  Food Insecurity: Not on file  Transportation Needs: Not on file  Physical Activity: Not on file  Stress: Not on file  Social Connections: Not on file  Intimate Partner Violence: Not on file    Review of Systems: See HPI, otherwise negative ROS  Physical Exam: BP (!) 178/90   Pulse 70   Temp 97.9 F (36.6 C) (Temporal)   Resp 20   Ht 5\' 3"  (1.6 m)   Wt 55.8 kg   SpO2 94%   BMI 21.79 kg/m  General:   Alert,  pleasant and cooperative in NAD Head:  Normocephalic and atraumatic. Lungs:  Clear to auscultation.    Heart:  Regular rate and rhythm.   Impression/Plan: Alexa Meza is  here for ophthalmic surgery.  Risks, benefits, limitations, and alternatives regarding ophthalmic surgery have been reviewed with the patient.  Questions have been answered.  All parties agreeable.   Lockie Mola, MD  09/25/2022, 8:49 AM

## 2022-09-25 NOTE — Transfer of Care (Signed)
Immediate Anesthesia Transfer of Care Note  Patient: Alexa Meza  Procedure(s) Performed: CATARACT EXTRACTION PHACO AND INTRAOCULAR LENS PLACEMENT (IOC) LEFT (Left: Eye)  Patient Location: PACU  Anesthesia Type: MAC  Level of Consciousness: awake, alert  and patient cooperative  Airway and Oxygen Therapy: Patient Spontanous Breathing and Patient connected to supplemental oxygen  Post-op Assessment: Post-op Vital signs reviewed, Patient's Cardiovascular Status Stable, Respiratory Function Stable, Patent Airway and No signs of Nausea or vomiting  Post-op Vital Signs: Reviewed and stable  Complications: There were no known notable events for this encounter.

## 2022-09-26 ENCOUNTER — Encounter: Payer: Self-pay | Admitting: Ophthalmology

## 2022-10-03 ENCOUNTER — Encounter: Payer: Self-pay | Admitting: Ophthalmology

## 2022-10-07 NOTE — Discharge Instructions (Signed)

## 2022-10-09 ENCOUNTER — Ambulatory Visit: Payer: Medicare PPO | Admitting: Anesthesiology

## 2022-10-09 ENCOUNTER — Ambulatory Visit
Admission: RE | Admit: 2022-10-09 | Discharge: 2022-10-09 | Disposition: A | Discharge status: Home / Self Care | Payer: Medicare PPO | Attending: Ophthalmology | Admitting: Ophthalmology

## 2022-10-09 ENCOUNTER — Encounter: Payer: Self-pay | Admitting: Ophthalmology

## 2022-10-09 ENCOUNTER — Other Ambulatory Visit: Payer: Self-pay

## 2022-10-09 ENCOUNTER — Encounter
Admission: RE | Disposition: A | Discharge status: Home / Self Care | Payer: Self-pay | Source: Home / Self Care | Attending: Ophthalmology

## 2022-10-09 DIAGNOSIS — I10 Essential (primary) hypertension: Secondary | ICD-10-CM | POA: Insufficient documentation

## 2022-10-09 DIAGNOSIS — E039 Hypothyroidism, unspecified: Secondary | ICD-10-CM | POA: Diagnosis not present

## 2022-10-09 DIAGNOSIS — H2511 Age-related nuclear cataract, right eye: Secondary | ICD-10-CM | POA: Diagnosis present

## 2022-10-09 HISTORY — PX: CATARACT EXTRACTION W/PHACO: SHX586

## 2022-10-09 SURGERY — PHACOEMULSIFICATION, CATARACT, WITH IOL INSERTION
Anesthesia: Monitor Anesthesia Care | Site: Eye | Laterality: Right

## 2022-10-09 MED ORDER — SIGHTPATH DOSE#1 BSS IO SOLN
INTRAOCULAR | Status: DC | PRN
  Administered 2022-10-09: 80 mL via OPHTHALMIC

## 2022-10-09 MED ORDER — BRIMONIDINE TARTRATE-TIMOLOL 0.2-0.5 % OP SOLN
OPHTHALMIC | Status: DC | PRN
  Administered 2022-10-09: 1 [drp] via OPHTHALMIC

## 2022-10-09 MED ORDER — SIGHTPATH DOSE#1 NA HYALUR & NA CHOND-NA HYALUR IO KIT
PACK | INTRAOCULAR | Status: DC | PRN
  Administered 2022-10-09: 1 via OPHTHALMIC

## 2022-10-09 MED ORDER — FENTANYL CITRATE (PF) 100 MCG/2ML IJ SOLN
INTRAMUSCULAR | Status: DC | PRN
  Administered 2022-10-09: 50 ug via INTRAVENOUS

## 2022-10-09 MED ORDER — TETRACAINE HCL 0.5 % OP SOLN
1.0000 [drp] | OPHTHALMIC | Status: DC | PRN
  Administered 2022-10-09 (×2): 1 [drp] via OPHTHALMIC

## 2022-10-09 MED ORDER — CEFUROXIME OPHTHALMIC INJECTION 1 MG/0.1 ML
INJECTION | OPHTHALMIC | Status: DC | PRN
  Administered 2022-10-09: .1 mL via INTRACAMERAL

## 2022-10-09 MED ORDER — SIGHTPATH DOSE#1 BSS IO SOLN
INTRAOCULAR | Status: DC | PRN
  Administered 2022-10-09: 1 mL

## 2022-10-09 MED ORDER — MIDAZOLAM HCL 2 MG/2ML IJ SOLN
INTRAMUSCULAR | Status: DC | PRN
  Administered 2022-10-09: 2 mg via INTRAVENOUS

## 2022-10-09 MED ORDER — SIGHTPATH DOSE#1 BSS IO SOLN
INTRAOCULAR | Status: DC | PRN
  Administered 2022-10-09: 15 mL

## 2022-10-09 MED ORDER — ARMC OPHTHALMIC DILATING DROPS
1.0000 | OPHTHALMIC | Status: DC | PRN
  Administered 2022-10-09 (×3): 1 via OPHTHALMIC

## 2022-10-09 SURGICAL SUPPLY — 20 items
CANNULA ANT/CHMB 27G (MISCELLANEOUS) IMPLANT
CANNULA ANT/CHMB 27GA (MISCELLANEOUS) IMPLANT
CATARACT SUITE SIGHTPATH (MISCELLANEOUS) ×1 IMPLANT
FEE CATARACT SUITE SIGHTPATH (MISCELLANEOUS) ×1 IMPLANT
GLOVE SRG 8 PF TXTR STRL LF DI (GLOVE) ×1 IMPLANT
GLOVE SURG ENC TEXT LTX SZ7.5 (GLOVE) ×1 IMPLANT
GLOVE SURG GAMMEX PI TX LF 7.5 (GLOVE) IMPLANT
GLOVE SURG UNDER POLY LF SZ8 (GLOVE) ×1
LENS IOL TECNIS EYHANCE 20.0 (Intraocular Lens) IMPLANT
NDL FILTER BLUNT 18X1 1/2 (NEEDLE) ×1 IMPLANT
NDL RETROBULBAR .5 NSTRL (NEEDLE) IMPLANT
NEEDLE FILTER BLUNT 18X1 1/2 (NEEDLE) ×1 IMPLANT
PACK VIT ANT 23G (MISCELLANEOUS) IMPLANT
RING MALYGIN 7.0 (MISCELLANEOUS) IMPLANT
SUT ETHILON 10-0 CS-B-6CS-B-6 (SUTURE)
SUT VICRYL  9 0 (SUTURE)
SUT VICRYL 9 0 (SUTURE) IMPLANT
SUTURE EHLN 10-0 CS-B-6CS-B-6 (SUTURE) IMPLANT
SYR 3ML LL SCALE MARK (SYRINGE) ×1 IMPLANT
WATER STERILE IRR 250ML POUR (IV SOLUTION) ×1 IMPLANT

## 2022-10-09 NOTE — Anesthesia Preprocedure Evaluation (Addendum)
Anesthesia Evaluation  Patient identified by MRN, date of birth, ID band Patient awake    Reviewed: Allergy & Precautions, NPO status , Patient's Chart, lab work & pertinent test results  History of Anesthesia Complications Negative for: history of anesthetic complications  Airway Mallampati: I  TM Distance: >3 FB Neck ROM: full    Dental  (+) Missing,    Pulmonary neg pulmonary ROS   Pulmonary exam normal breath sounds clear to auscultation       Cardiovascular hypertension, Normal cardiovascular exam Rhythm:Regular Rate:Normal     Neuro/Psych negative neurological ROS  negative psych ROS   GI/Hepatic negative GI ROS, Neg liver ROS,,,  Endo/Other  Hypothyroidism    Renal/GU negative Renal ROS     Musculoskeletal   Abdominal Normal abdominal exam  (+)   Peds  Hematology negative hematology ROS (+)   Anesthesia Other Findings Past Medical History: No date: Hypothyroidism     Comment:  HX OF NO CURRENT MEDS FOR, SEES  FAMILY MD FOR  Past Surgical History: 09/25/2022: CATARACT EXTRACTION W/PHACO; Left     Comment:  Procedure: CATARACT EXTRACTION PHACO AND INTRAOCULAR               LENS PLACEMENT ([place]) LEFT;  Surgeon: Leandrew Koyanagi, MD;  Location: [place];  Service:               Ophthalmology;  Laterality: Left;  4.50 0:52.2 06/24/2013: EUS; N/A     Comment:  Procedure: UPPER ENDOSCOPIC ULTRASOUND (EUS) LINEAR;                Surgeon: Milus Banister, MD;  Location: [facility];                Service: Endoscopy;  Laterality: N/A; AS CHILD: TONSILLECTOMY  BMI    Body Mass Index: 22.14 kg/m      Reproductive/Obstetrics negative OB ROS                             Anesthesia Physical Anesthesia Plan  ASA: 2  Anesthesia Plan: MAC   Post-op Pain Management:    Induction: Intravenous  PONV Risk Score and Plan: 2 and Treatment may vary due to  age or medical condition, Midazolam and TIVA  Airway Management Planned: Natural Airway and Nasal Cannula  Additional Equipment:   Intra-op Plan:   Post-operative Plan:   Informed Consent: I have reviewed the patients History and Physical, chart, labs and discussed the procedure including the risks, benefits and alternatives for the proposed anesthesia with the patient or authorized representative who has indicated his/her understanding and acceptance.     Dental advisory given  Plan Discussed with: Anesthesiologist, CRNA and Surgeon  Anesthesia Plan Comments:        Anesthesia Quick Evaluation

## 2022-10-09 NOTE — Op Note (Signed)
  LOCATION:  [facility]   PREOPERATIVE DIAGNOSIS:    Nuclear sclerotic cataract right eye. H25.11   POSTOPERATIVE DIAGNOSIS:  Nuclear sclerotic cataract right eye.     PROCEDURE:  Phacoemusification with posterior chamber intraocular lens placement of the right eye   ULTRASOUND TIME: Procedure(s) with comments: CATARACT EXTRACTION PHACO AND INTRAOCULAR LENS PLACEMENT (IOC) RIGHT (Right) - 6.21 1:06.8  LENS:   Implant Name Type Inv. Item Serial No. Manufacturer Lot No. LRB No. Used Action  LENS IOL TECNIS EYHANCE 20.0 - [id number] Intraocular Lens LENS IOL TECNIS EYHANCE 20.0 [id number] [employer]  Right 1 Implanted         SURGEON:  Deirdre Evener, MD   ANESTHESIA:  Topical with tetracaine drops and 2% Xylocaine jelly, augmented with 1% preservative-free intracameral lidocaine.    COMPLICATIONS:  None.   DESCRIPTION OF PROCEDURE:  The patient was identified in the holding room and transported to the operating room and placed in the supine position under the operating microscope.  The right eye was identified as the operative eye and it was prepped and draped in the usual sterile ophthalmic fashion.   A 1 millimeter clear-corneal paracentesis was made at the 12:00 position.  0.5 ml of preservative-free 1% lidocaine was injected into the anterior chamber. The anterior chamber was filled with Viscoat viscoelastic.  A 2.4 millimeter keratome was used to make a near-clear corneal incision at the 9:00 position.  A curvilinear capsulorrhexis was made with a cystotome and capsulorrhexis forceps.  Balanced salt solution was used to hydrodissect and hydrodelineate the nucleus.   Phacoemulsification was then used in stop and chop fashion to remove the lens nucleus and epinucleus.  The remaining cortex was then removed using the irrigation and aspiration handpiece. Provisc was then placed into the capsular bag to distend it for lens placement.  A lens was then injected into the  capsular bag.  The remaining viscoelastic was aspirated.   Wounds were hydrated with balanced salt solution.  The anterior chamber was inflated to a physiologic pressure with balanced salt solution.  No wound leaks were noted. Cefuroxime 0.1 ml of a 10mg /ml solution was injected into the anterior chamber for a dose of 1 mg of intracameral antibiotic at the completion of the case.   Timolol and Brimonidine drops were applied to the eye.  The patient was taken to the recovery room in stable condition without complications of anesthesia or surgery.   Janna Oak 10/09/2022, 8:49 AM

## 2022-10-09 NOTE — H&P (Signed)
[facility]   Primary Care Physician:  Lauro Regulus, MD Ophthalmologist: Dr. Lockie Mola  Pre-Procedure History & Physical: HPI:  Alexa Meza is a 76 y.o. female here for ophthalmic surgery.   Past Medical History:  Diagnosis Date   Hypothyroidism    HX OF NO CURRENT MEDS FOR, SEES  FAMILY MD FOR    Past Surgical History:  Procedure Laterality Date   CATARACT EXTRACTION W/PHACO Left 09/25/2022   Procedure: CATARACT EXTRACTION PHACO AND INTRAOCULAR LENS PLACEMENT (IOC) LEFT;  Surgeon: Lockie Mola, MD;  Location: [facility];  Service: Ophthalmology;  Laterality: Left;  4.50 0:52.2   EUS N/A 06/24/2013   Procedure: UPPER ENDOSCOPIC ULTRASOUND (EUS) LINEAR;  Surgeon: Rachael Fee, MD;  Location: [facility];  Service: Endoscopy;  Laterality: N/A;   TONSILLECTOMY  AS CHILD    Prior to Admission medications   Medication Sig Start Date End Date Taking? Authorizing Provider  amLODipine (NORVASC) 2.5 MG tablet Take 2.5 mg by mouth daily. Not taking right now   Yes [provider]  cholecalciferol (VITAMIN D) 1000 UNITS tablet Take 1,000 Units by mouth daily.   Yes [provider]  fish oil-omega-3 fatty acids 1000 MG capsule Take 1 g by mouth daily.   Yes [provider]  levothyroxine (SYNTHROID) 25 MCG tablet Take 25 mcg by mouth daily before breakfast.   Yes [provider]  magnesium oxide (MAG-OX) 400 (240 Mg) MG tablet Take 400 mg by mouth daily.   Yes [provider]  Multiple Vitamin (MULTIVITAMIN WITH MINERALS) TABS tablet Take 1 tablet by mouth daily.   Yes [provider]  Aloe Vera GEL Apply topically as needed. TO CHEST AREA Patient not taking: Reported on 09/19/2022    [provider]  calcium carbonate (OS-CAL) 600 MG TABS tablet Take 600 mg by mouth 2 (two) times daily with a meal. Patient not taking: Reported on 09/19/2022    [provider]   ciprofloxacin (CIPRO) 500 MG tablet Take 1 tablet (500 mg total) by mouth 2 (two) times daily. Patient not taking: Reported on 09/19/2022 06/24/13   Rachael Fee, MD    Allergies as of 08/06/2022   (No Active Allergies)    History reviewed. No pertinent family history.  Social History   Socioeconomic History   Marital status: Divorced    Spouse name: Not on file   Number of children: Not on file   Years of education: Not on file   Highest education level: Not on file  Occupational History   Not on file  Tobacco Use   Smoking status: Never   Smokeless tobacco: Never  Substance and Sexual Activity   Alcohol use: No   Drug use: No   Sexual activity: Not on file  Other Topics Concern   Not on file  Social History Narrative   Not on file   Social Determinants of Health   Financial Resource Strain: Not on file  Food Insecurity: Not on file  Transportation Needs: Not on file  Physical Activity: Not on file  Stress: Not on file  Social Connections: Not on file  Intimate Partner Violence: Not on file    Review of Systems: See HPI, otherwise negative ROS  Physical Exam: BP (!) 169/96   Temp 98.3 F (36.8 C) (Tympanic)   Ht 5' 2.99" (1.6 m)   Wt 55.6 kg   SpO2 98%   BMI 21.71 kg/m  General:   Alert,  pleasant  and cooperative in NAD Head:  Normocephalic and atraumatic. Lungs:  Clear to auscultation.    Heart:  Regular rate and rhythm.   Impression/Plan: Alexa Meza is here for ophthalmic surgery.  Risks, benefits, limitations, and alternatives regarding ophthalmic surgery have been reviewed with the patient.  Questions have been answered.  All parties agreeable.   Lockie Mola, MD  10/09/2022, 8:24 AM

## 2022-10-09 NOTE — Transfer of Care (Signed)
Immediate Anesthesia Transfer of Care Note  Patient: Alexa Meza  Procedure(s) Performed: CATARACT EXTRACTION PHACO AND INTRAOCULAR LENS PLACEMENT (IOC) RIGHT (Right: Eye)  Patient Location: PACU  Anesthesia Type: MAC  Level of Consciousness: awake, alert  and patient cooperative  Airway and Oxygen Therapy: Patient Spontanous Breathing and Patient connected to supplemental oxygen  Post-op Assessment: Post-op Vital signs reviewed, Patient's Cardiovascular Status Stable, Respiratory Function Stable, Patent Airway and No signs of Nausea or vomiting  Post-op Vital Signs: Reviewed and stable  Complications: No notable events documented.

## 2022-10-09 NOTE — Anesthesia Postprocedure Evaluation (Signed)
Anesthesia Post Note  Patient: Alexa Meza  Procedure(s) Performed: CATARACT EXTRACTION PHACO AND INTRAOCULAR LENS PLACEMENT (IOC) RIGHT (Right: Eye)  Patient location during evaluation: PACU Anesthesia Type: MAC Level of consciousness: awake and alert Pain management: pain level controlled Vital Signs Assessment: post-procedure vital signs reviewed and stable Respiratory status: spontaneous breathing, nonlabored ventilation and respiratory function stable Cardiovascular status: blood pressure returned to baseline and stable Postop Assessment: no apparent nausea or vomiting Anesthetic complications: no   No notable events documented.   Last Vitals:  Vitals:   10/09/22 0849 10/09/22 0855  BP: (!) 151/91 (!) 143/98  Pulse: 77 74  Resp: 17 17  Temp: 36.6 C   SpO2: 96% 95%    Last Pain:  Vitals:   10/09/22 0855  TempSrc:   PainSc: 0-No pain                 [place] Ouch

## 2022-10-10 ENCOUNTER — Encounter: Payer: Self-pay | Admitting: Ophthalmology

## 2023-03-03 IMAGING — MG MM DIGITAL SCREENING BILAT W/ TOMO AND CAD
6 of 10 series · 6 of 30 positions shown · non-contrast
Comparison: Previous exam(s).

CLINICAL DATA: Screening.

EXAM:
DIGITAL SCREENING BILATERAL MAMMOGRAM WITH TOMOSYNTHESIS AND CAD
TECHNIQUE: Bilateral screening digital craniocaudal and mediolateral oblique
mammograms were obtained. Bilateral screening digital breast
tomosynthesis was performed. The images were evaluated with
computer-aided detection.

[L CC synth-2D]
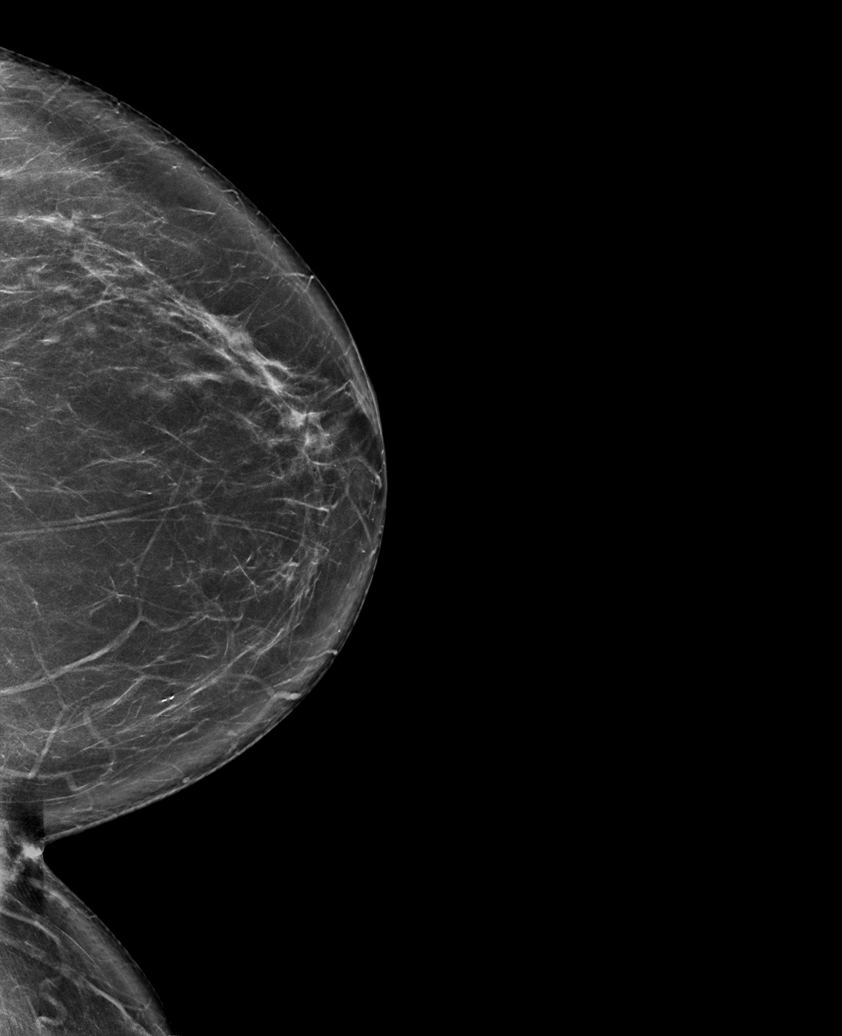

[L MLO synth-2D]
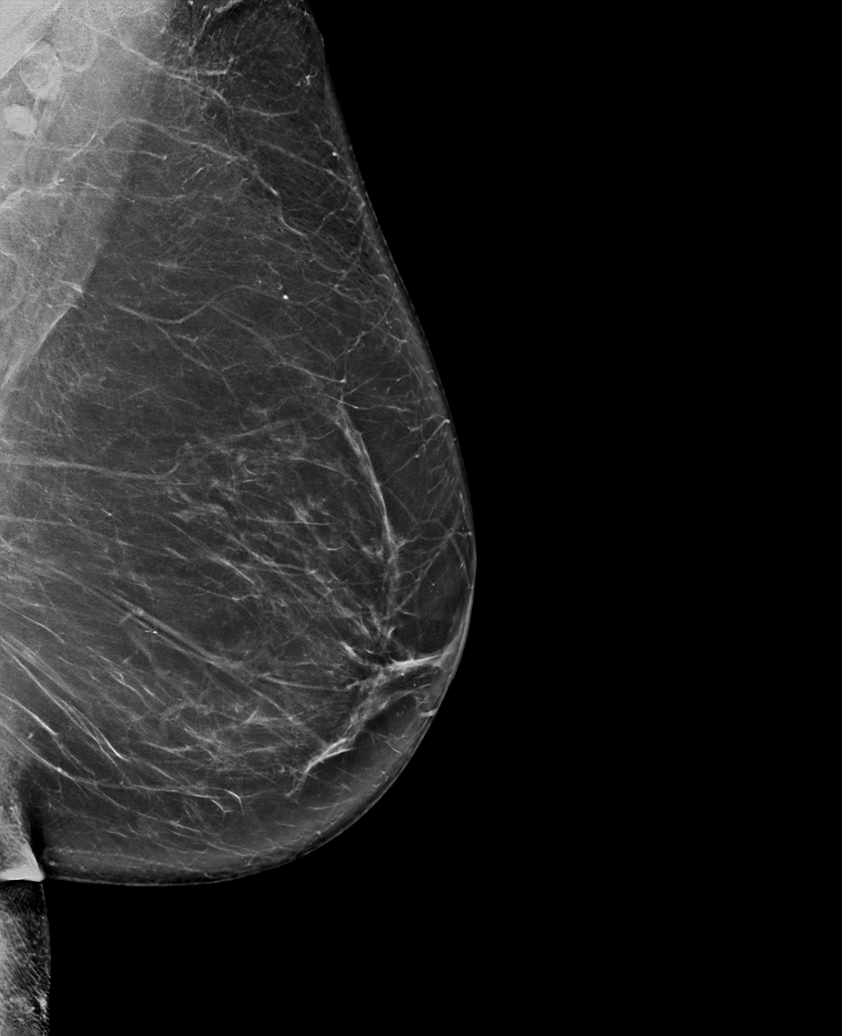

[R CC synth-2D]
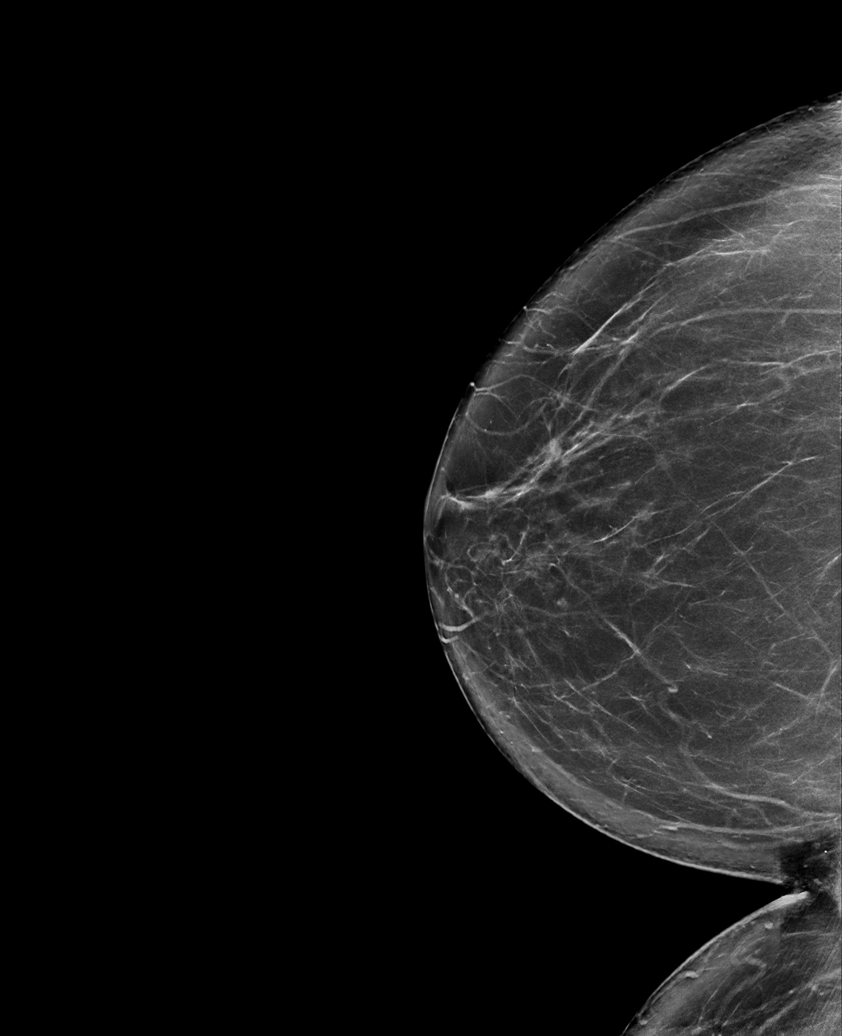

[R MLO synth-2D (1 of 2)]
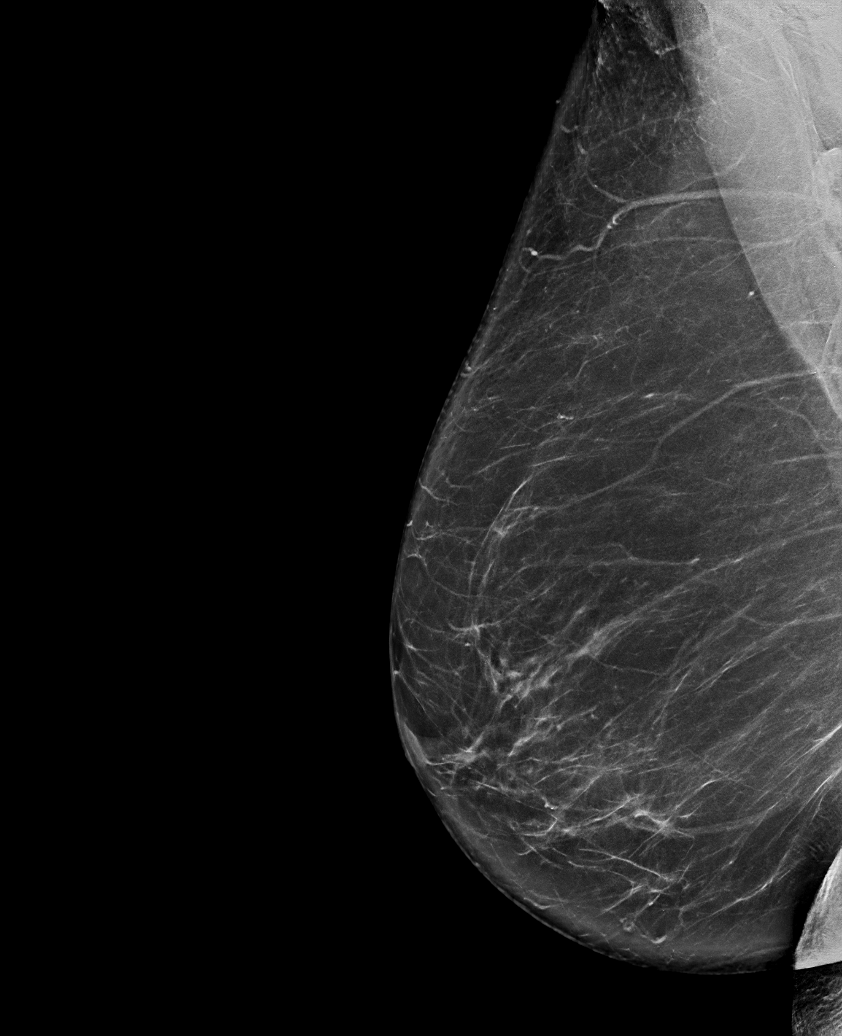

[R MLO synth-2D (2 of 2)]
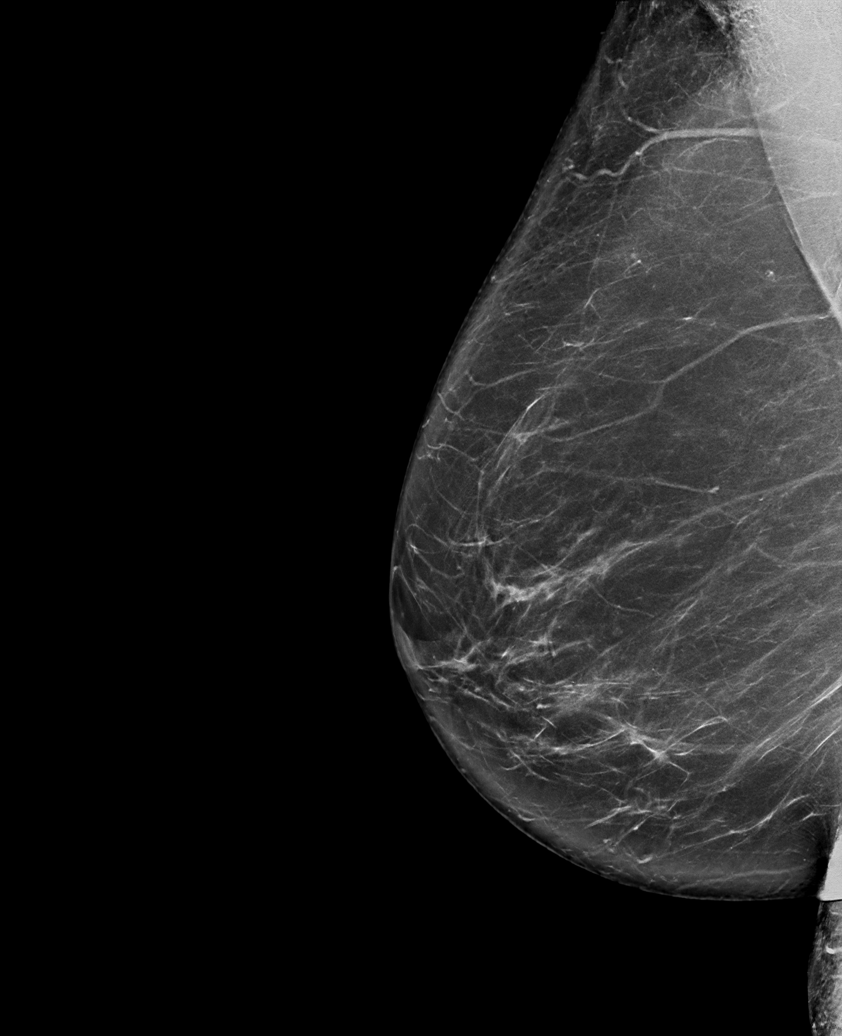

[L MLO tomo · tomo slice 45/90.0]
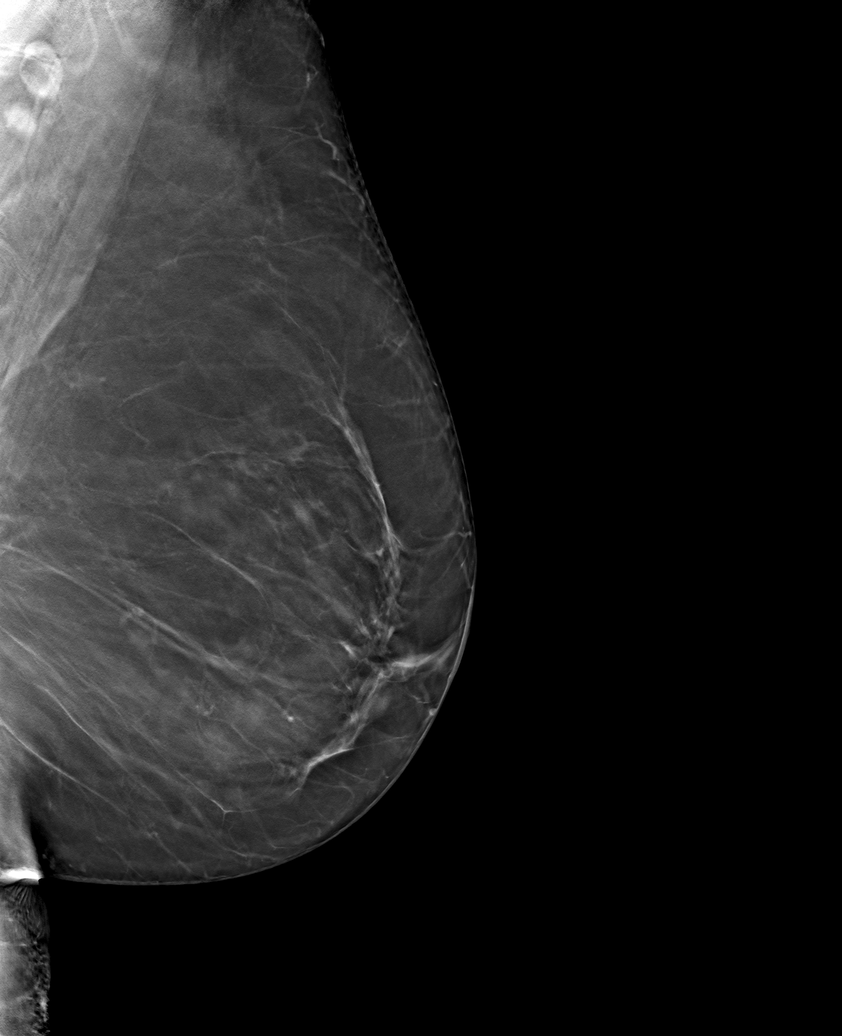

[6 of 30 positions shown; findings below may reference images not displayed]

ACR Breast Density Category b: There are scattered areas of
fibroglandular density.
FINDINGS: There are no findings suspicious for malignancy.
IMPRESSION: No mammographic evidence of malignancy. A result letter of this
screening mammogram will be mailed directly to the patient.

RECOMMENDATION:
Screening mammogram in one year. (Code:51-O-LD2)

BI-RADS CATEGORY  1: Negative.
